# Patient Record
Sex: Female | Born: 1960 | Race: White | Hispanic: No | Marital: Married | Smoking: Current every day smoker
Health system: Southern US, Community
[De-identification: ages and names within clinical notes are randomized; demographics above are authoritative.]

## PROBLEM LIST (undated history)

## (undated) DIAGNOSIS — I2510 Atherosclerotic heart disease of native coronary artery without angina pectoris: Secondary | ICD-10-CM

---

## 2020-04-03 ENCOUNTER — Other Ambulatory Visit: Payer: Self-pay

## 2020-04-03 ENCOUNTER — Encounter (HOSPITAL_BASED_OUTPATIENT_CLINIC_OR_DEPARTMENT_OTHER): Payer: Self-pay | Admitting: Emergency Medicine

## 2020-04-03 ENCOUNTER — Emergency Department (HOSPITAL_BASED_OUTPATIENT_CLINIC_OR_DEPARTMENT_OTHER): Payer: 59 | Admitting: Radiology

## 2020-04-03 ENCOUNTER — Emergency Department (HOSPITAL_BASED_OUTPATIENT_CLINIC_OR_DEPARTMENT_OTHER)
Admission: EM | Admit: 2020-04-03 | Discharge: 2020-04-03 | Disposition: A | Discharge status: Home / Self Care | Payer: 59 | Attending: Emergency Medicine | Admitting: Emergency Medicine

## 2020-04-03 DIAGNOSIS — M79644 Pain in right finger(s): Secondary | ICD-10-CM | POA: Insufficient documentation

## 2020-04-03 DIAGNOSIS — M79641 Pain in right hand: Secondary | ICD-10-CM | POA: Diagnosis not present

## 2020-04-03 DIAGNOSIS — I2510 Atherosclerotic heart disease of native coronary artery without angina pectoris: Secondary | ICD-10-CM | POA: Diagnosis not present

## 2020-04-03 DIAGNOSIS — F17200 Nicotine dependence, unspecified, uncomplicated: Secondary | ICD-10-CM | POA: Insufficient documentation

## 2020-04-03 HISTORY — DX: Atherosclerotic heart disease of native coronary artery without angina pectoris: I25.10

## 2020-04-03 MED ORDER — ACETAMINOPHEN 325 MG PO TABS
650.0000 mg | ORAL_TABLET | Freq: Once | ORAL | Status: AC
  Administered 2020-04-03: 650 mg via ORAL
  Filled 2020-04-03: qty 2

## 2020-04-03 NOTE — ED Provider Notes (Signed)
[facility] EMERGENCY DEPT Provider Note   CSN: [id number] Arrival date & time: 04/03/20  1025     History Chief Complaint  Patient presents with  . Hand Pain    Jenna Woodward is a 60 y.o. female.  Right middle finger/knuckle pain for months and years, worse recently. No speicific truama   The history is provided by the patient.  Hand Pain This is a new problem. The problem occurs constantly. The problem has not changed since onset.Exacerbated by: movement. Nothing relieves the symptoms. She has tried nothing for the symptoms. The treatment provided no relief.       Past Medical History:  Diagnosis Date  . Coronary artery disease     There are no problems to display for this patient.   History reviewed. No pertinent surgical history.   OB History   No obstetric history on file.     History reviewed. No pertinent family history.  Social History   Tobacco Use  . Smoking status: Current Every Day Smoker  . Smokeless tobacco: Never Used  Substance Use Topics  . Alcohol use: Not Currently  . Drug use: Never    Home Medications Prior to Admission medications   Not on File    Allergies    Patient has no known allergies.  Review of Systems   Review of Systems  Constitutional: Negative for fever.  Musculoskeletal: Positive for arthralgias. Negative for back pain and joint swelling.  Skin: Negative for color change, pallor, rash and wound.  Neurological: Negative for weakness and numbness.    Physical Exam Updated Vital Signs  ED Triage Vitals  Enc Vitals Group     BP      Pulse      Resp      Temp      Temp src      SpO2      Weight      Height      Head Circumference      Peak Flow      Pain Score      Pain Loc      Pain Edu?      Excl. in GC?     Physical Exam Vitals reviewed.  Constitutional:      General: She is not in acute distress.    Appearance: She is not ill-appearing.  Cardiovascular:     Pulses: Normal  pulses.  Musculoskeletal:        General: Tenderness present. No swelling. Normal range of motion.     Comments: Patient has tenderness to the right middle finger MCP, no redness, normal range of motion of the fingers, there is some deformity over the knuckle but this appears to likely be chronic  Skin:    Capillary Refill: Capillary refill takes less than 2 seconds.  Neurological:     General: No focal deficit present.     Mental Status: She is alert.     Sensory: No sensory deficit.     Motor: No weakness.     ED Results / Procedures / Treatments   Labs (all labs ordered are listed, but only abnormal results are displayed) Labs Reviewed - No data to display  EKG None  Radiology DG Hand Complete Right  Result Date: 04/03/2020 CLINICAL DATA:  60 year old female with right hand pain, no known injury EXAM: RIGHT HAND - COMPLETE 3+ VIEW COMPARISON:  None. FINDINGS: No evidence of acute fracture or malalignment. 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 There appears to be volar subluxation of the proximal phalanx with respect to the metacarpal head. No inflammatory erosions or asymmetric joint space narrowing. No lytic or blastic osseous lesion. IMPRESSION: 1. Hypertrophy of the head of the third metacarpal appears to be secondary to degenerative osteoarthritis and osteophyte formation. 2. 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 Electronically Signed   By: Malachy Moan M.D.   On: 04/03/2020 11:25    Procedures Procedures   Medications Ordered in ED Medications  acetaminophen (TYLENOL) tablet 650 mg (650 mg Oral Given 04/03/20 1124)    ED Course  I have reviewed the triage vital signs and the nursing notes.  Pertinent labs & imaging results that were available during my care of the  patient were reviewed by me and considered in my medical decision making (see chart for details).    MDM Rules/Calculators/A&P                          [name] is here with right hand pain.  Overall fairly chronic but worsening here recently.  Has some deformity to the right middle finger knuckle which she says has been there for months to years.  No specific trauma.  Has been told in the past that it was arthritis.  Suspect that she likely has a bad arthritis in this joint that is now getting worse and causing some local issues.  She has overall fairly good range of motion.  We will get an x-ray to evaluate for fracture or dislocation.  Could be a chronic dislocation that has scarred over as well.  Will refer her to hand surgery.  No concern for infectious process.  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  Overall worsening arthritis.  Will refer to hand surgeon.  Recommend conservative treatment at home.  Discharged in good condition.  This chart was dictated using voice recognition software.  Despite best efforts to proofread,  errors can occur which can change the documentation meaning.    Final Clinical Impression(s) / ED Diagnoses Final diagnoses:  Right hand pain    Rx / DC Orders ED Discharge Orders    None       Virgina Norfolk, DO 04/03/20 1145

## 2020-04-03 NOTE — Discharge Instructions (Addendum)
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  Recommend Tylenol Motrin for pain.  Talk with your primary care doctor about finding a more local hand surgeon.  I have attached the name of our local hand surgeon.

## 2020-04-03 NOTE — ED Notes (Signed)
Pt discharged home after verbalizing understanding of discharge instructions; nad noted. 

## 2020-04-03 NOTE — ED Triage Notes (Signed)
Pt via pov from home with right hand pain. Pt states she has often felt like her joints "pop out" but that she "wiggles" her fingers and it returns to normal. Pt denies any recent injury, but states she fell in January and is concerned there is damage from that. Pt states that last night she began to feel increased pain in the middle finger of her right hand, and that it is more swollen than usual. She also reports that she is unable to flatten her hand or straighten her middle finger; pain is in the palm of the hand. Pt alert & oriented, nad noted.

## 2022-01-17 IMAGING — DX DG HAND COMPLETE 3+V*R*
1 series · 3 of 3 positions shown · non-contrast
Comparison: None.

CLINICAL DATA: 59-year-old female with right hand pain, no known
injury

EXAM:
RIGHT HAND - COMPLETE 3+ VIEW

[Series 1: hand · 0.14mm/px · 3 of 3 slices shown]
[im 1/3]
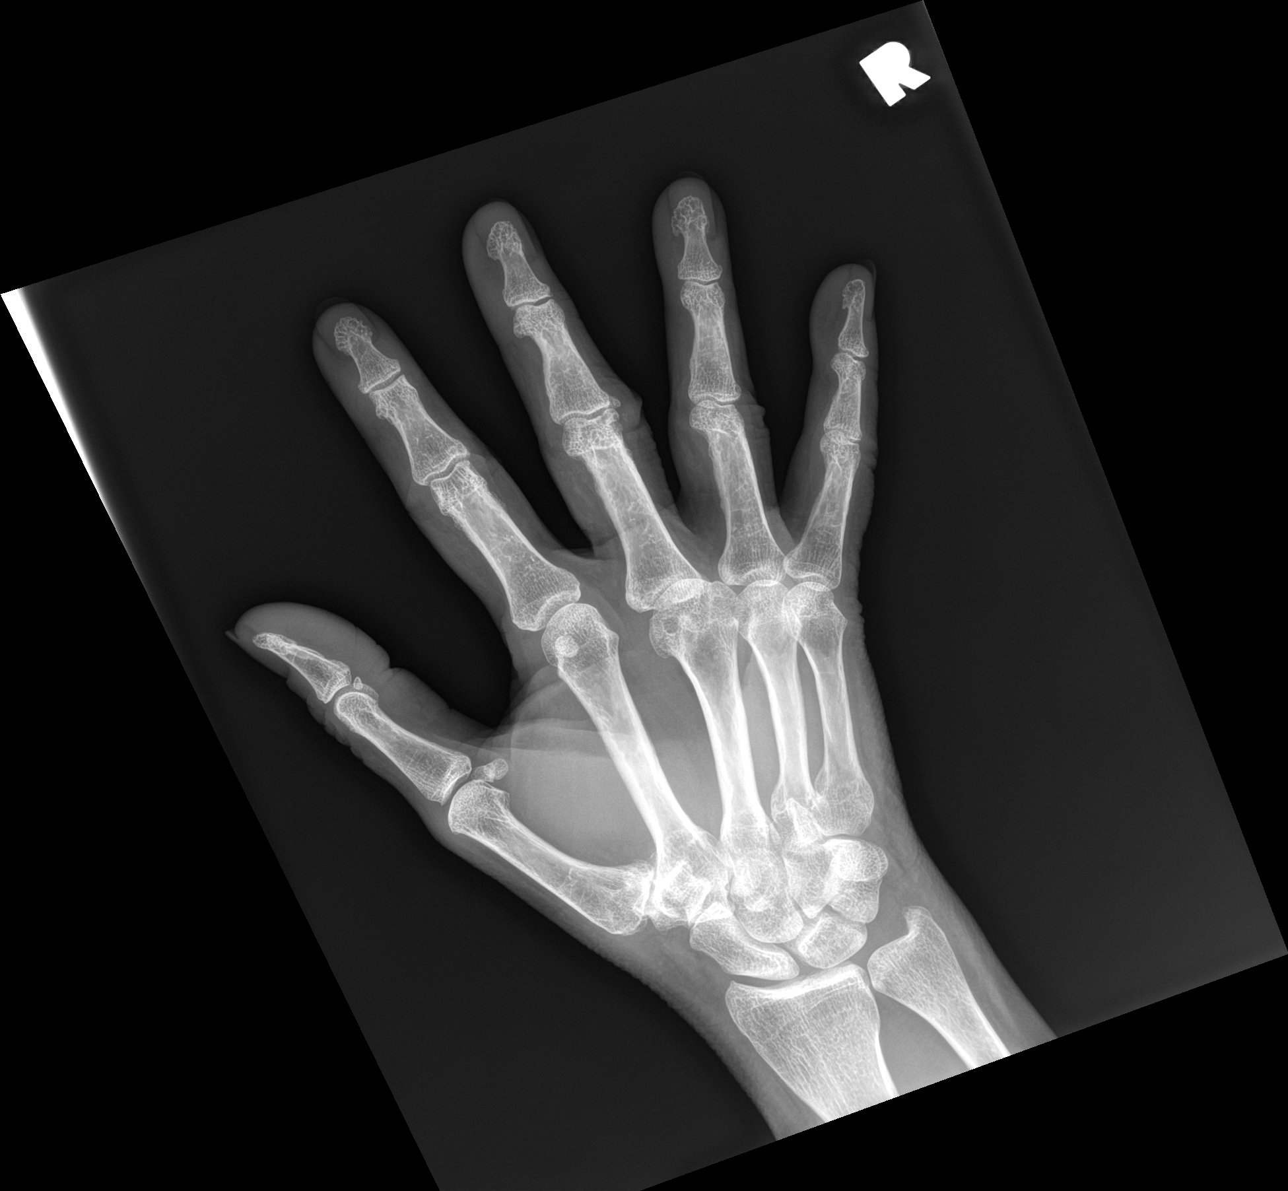
[im 2/3]
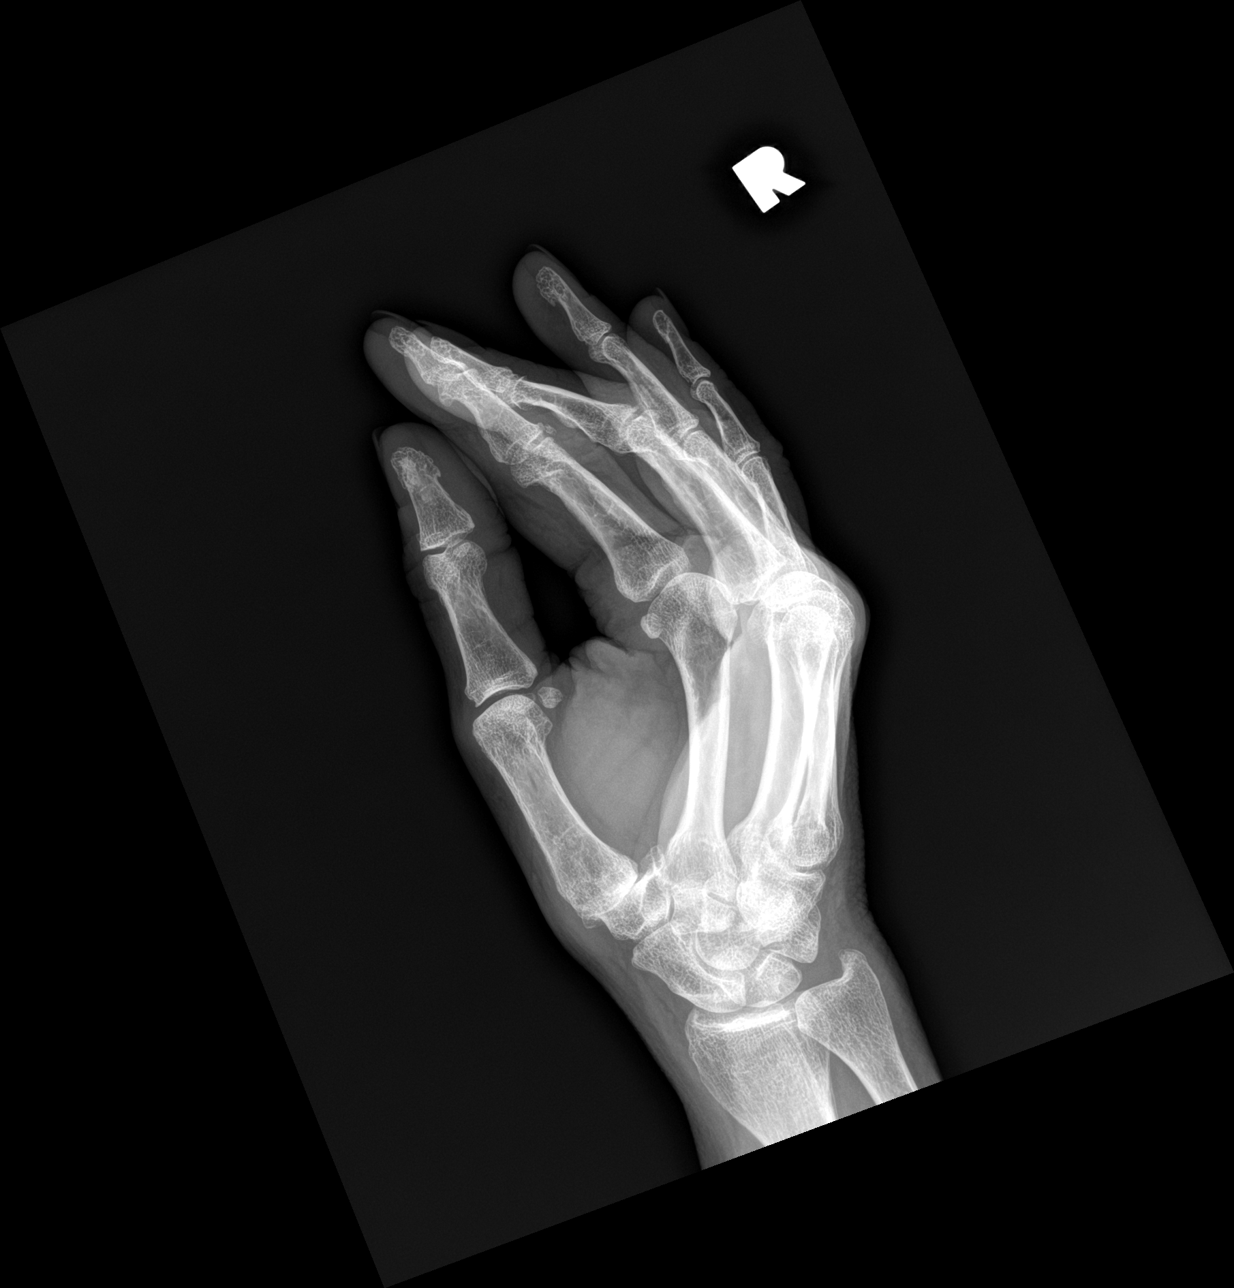
[im 3/3]
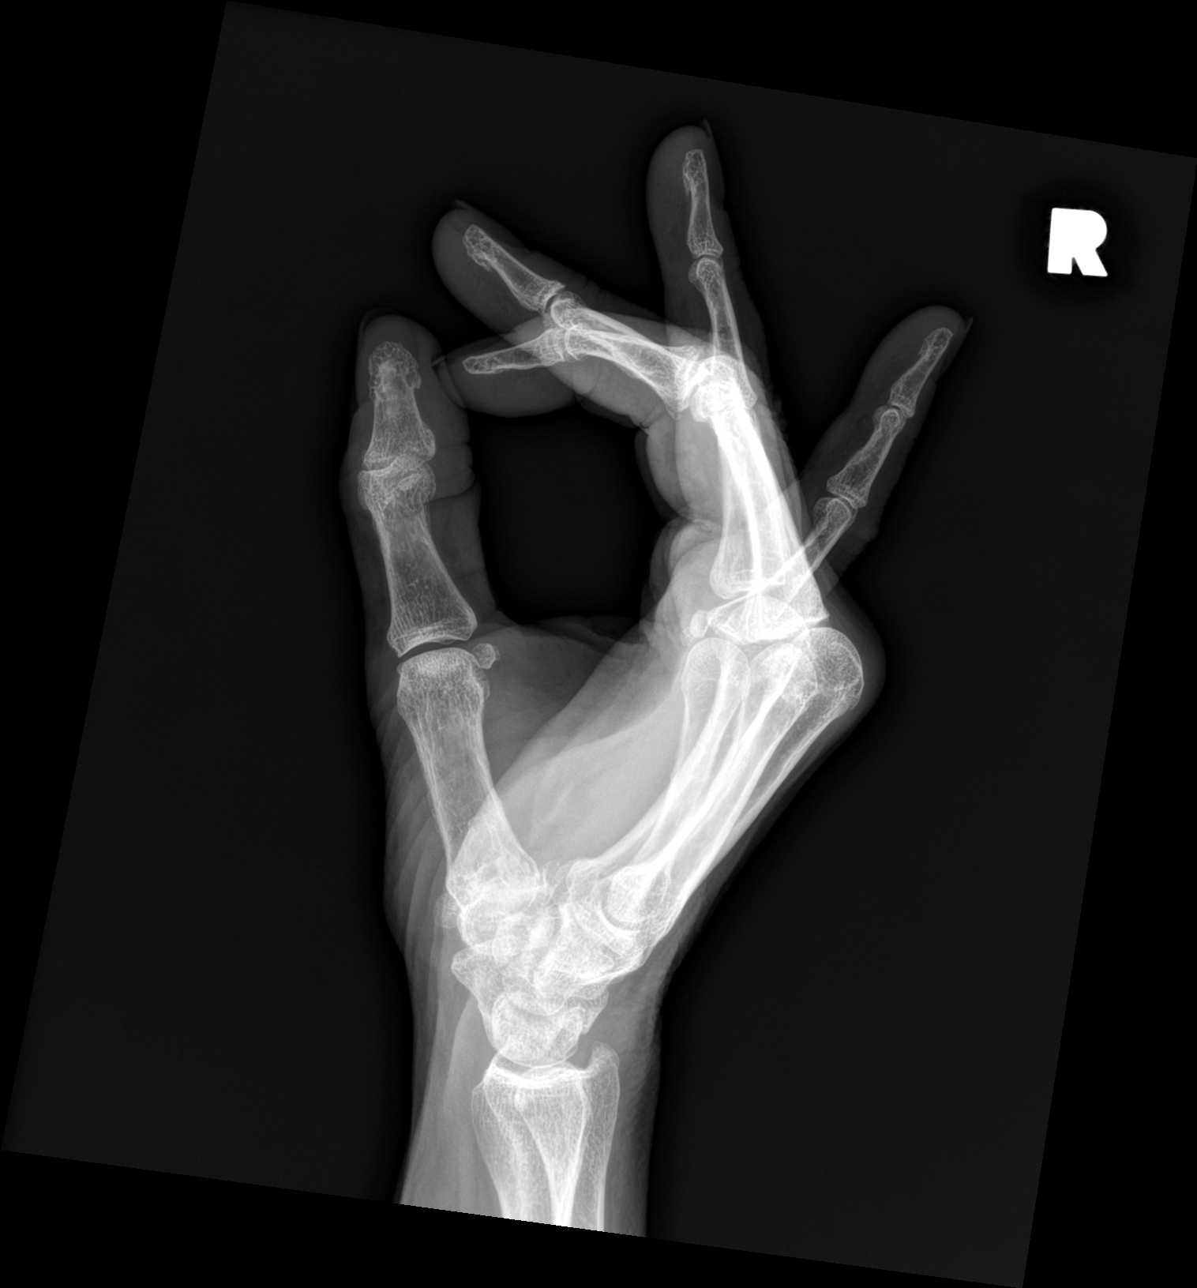

[3 of 3 positions shown; findings below may reference images not displayed]

FINDINGS: No evidence of acute fracture or malalignment. Degenerative
osteoarthritis present of the third MCP joint with relative
hypertrophy of the metacarpal head secondary to osteophyte
production. There appears to be volar subluxation of the proximal
phalanx with respect to the metacarpal head. No inflammatory
erosions or asymmetric joint space narrowing. No lytic or blastic
osseous lesion.
IMPRESSION: 1. Hypertrophy of the head of the third metacarpal appears to be
secondary to degenerative osteoarthritis and osteophyte formation.
2. There appears to be secondary subluxation of the proximal phalanx
of the long finger with respect to the metacarpal head at the MCP
joint.
